# Patient Record
Sex: Female | Born: 1965 | Race: Black or African American | Hispanic: No | Marital: Married | Smoking: Never smoker
Health system: Southern US, Community
[De-identification: ages and names within clinical notes are randomized; demographics above are authoritative.]

## PROBLEM LIST (undated history)

## (undated) DIAGNOSIS — F32A Depression, unspecified: Secondary | ICD-10-CM

## (undated) DIAGNOSIS — F419 Anxiety disorder, unspecified: Secondary | ICD-10-CM

## (undated) DIAGNOSIS — E119 Type 2 diabetes mellitus without complications: Secondary | ICD-10-CM

## (undated) DIAGNOSIS — E7800 Pure hypercholesterolemia, unspecified: Secondary | ICD-10-CM

## (undated) HISTORY — PX: ROTATOR CUFF REPAIR: SHX139

---

## 1998-10-08 ENCOUNTER — Other Ambulatory Visit: Admission: RE | Admit: 1998-10-08 | Discharge: 1998-10-08 | Payer: Self-pay | Admitting: Obstetrics and Gynecology

## 2001-09-01 ENCOUNTER — Other Ambulatory Visit: Admission: RE | Admit: 2001-09-01 | Discharge: 2001-09-01 | Payer: Self-pay | Admitting: Obstetrics and Gynecology

## 2001-11-09 HISTORY — PX: REDUCTION MAMMAPLASTY: SUR839

## 2002-07-13 ENCOUNTER — Encounter: Admission: RE | Admit: 2002-07-13 | Discharge: 2002-07-13 | Payer: Self-pay | Admitting: Obstetrics and Gynecology

## 2002-07-13 ENCOUNTER — Encounter: Payer: Self-pay | Admitting: Obstetrics and Gynecology

## 2002-07-13 ENCOUNTER — Other Ambulatory Visit: Admission: RE | Admit: 2002-07-13 | Discharge: 2002-07-13 | Payer: Self-pay | Admitting: Obstetrics and Gynecology

## 2002-08-07 ENCOUNTER — Ambulatory Visit (HOSPITAL_BASED_OUTPATIENT_CLINIC_OR_DEPARTMENT_OTHER): Admission: RE | Admit: 2002-08-07 | Discharge: 2002-08-08 | Payer: Self-pay | Admitting: Plastic Surgery

## 2002-08-08 ENCOUNTER — Encounter (INDEPENDENT_AMBULATORY_CARE_PROVIDER_SITE_OTHER): Payer: Self-pay | Admitting: Specialist

## 2002-10-12 ENCOUNTER — Other Ambulatory Visit: Admission: RE | Admit: 2002-10-12 | Discharge: 2002-10-12 | Payer: Self-pay | Admitting: Obstetrics and Gynecology

## 2003-06-06 ENCOUNTER — Other Ambulatory Visit: Admission: RE | Admit: 2003-06-06 | Discharge: 2003-06-06 | Payer: Self-pay | Admitting: Obstetrics & Gynecology

## 2003-12-12 ENCOUNTER — Other Ambulatory Visit: Admission: RE | Admit: 2003-12-12 | Discharge: 2003-12-12 | Payer: Self-pay | Admitting: Obstetrics & Gynecology

## 2004-10-31 ENCOUNTER — Encounter: Admission: RE | Admit: 2004-10-31 | Discharge: 2004-10-31 | Payer: Self-pay | Admitting: Obstetrics and Gynecology

## 2006-06-23 ENCOUNTER — Encounter: Admission: RE | Admit: 2006-06-23 | Discharge: 2006-06-23 | Payer: Self-pay | Admitting: Obstetrics and Gynecology

## 2007-07-14 ENCOUNTER — Encounter: Admission: RE | Admit: 2007-07-14 | Discharge: 2007-07-14 | Payer: Self-pay | Admitting: Obstetrics and Gynecology

## 2008-07-19 ENCOUNTER — Encounter: Admission: RE | Admit: 2008-07-19 | Discharge: 2008-07-19 | Payer: Self-pay | Admitting: Obstetrics and Gynecology

## 2009-07-22 ENCOUNTER — Encounter: Admission: RE | Admit: 2009-07-22 | Discharge: 2009-07-22 | Payer: Self-pay | Admitting: Obstetrics and Gynecology

## 2010-07-23 ENCOUNTER — Encounter: Admission: RE | Admit: 2010-07-23 | Discharge: 2010-07-23 | Payer: Self-pay | Admitting: Obstetrics and Gynecology

## 2011-03-27 NOTE — Op Note (Signed)
NAME:  Lisa Finley, Lisa Finley                         ACCOUNT NO.:  [id number]   MEDICAL RECORD NO.:  [id number]                   PATIENT TYPE:  AMB   LOCATION:  DSC                                  FACILITY:  [facility]   PHYSICIAN:  Mary A. Contogiannis, M.D.          DATE OF BIRTH:  1966/07/01   DATE OF PROCEDURE:  08/07/2002  DATE OF DISCHARGE:                                 OPERATIVE REPORT   PREOPERATIVE DIAGNOSES:  Bilateral macromastia.   POSTOPERATIVE DIAGNOSES:  Bilateral macromastia.   OPERATION PERFORMED:  Bilateral reduction mammoplasties.   SURGEON:  Mary A. Contogiannis, M.D.   ASSISTANT:  Charles A. Clearance Coots, M.D.   ANESTHESIA:  General endotracheal.   ANESTHESIOLOGIST:  Kaylyn Layer. Michelle Piper, M.D.   ESTIMATED BLOOD LOSS:  250 cc.   FLUIDS REPLACED:  2800 cc crystalloid   URINE OUTPUT:  600 cc.   COMPLICATIONS:  None.   INDICATIONS FOR PROCEDURE:  The patient is a 45 year old African-American  female who  presents with bilateral macromastia that is clinically  symptomatic.  She complains of backaches, neckaches, shoulder strap groove  marks and pain with intertriginous skin rashes.  She requests that we  proceed with bilateral reduction mammoplasties.   JUSTIFICATION FOR OVERNIGHT STAY:  Progressive pain control along with  ambulation and monitoring of the nipples and breasts.   DESCRIPTION OF PROCEDURE:  The patient was marked in the preop holding area  in the pattern of Wise for the featured bilateral reduction mammoplasties.  She was then taken back to the operating room and placed on the table in the  supine position.  After adequate general endotracheal anesthesia was  obtained, the patient's chest was prepped with Betadine and alcohol and  draped in sterile fashion.   First the right breast reduction was performed.  The nipple was marked with  a 45 mm nipple marker.  This was then incised with the skin de-  epithelialized around the depth of the inframammary  crease in the inferior  pedicle pattern. Next the medial, superior and lateral skin flaps were  elevated down to the chest wall.  The excess fat and glandular tissue were  removed from the inferior pedicle.  The nipple was then examined and found  to be pink and viable.  The wound was then irrigated with saline irrigation.  Meticulous hemostasis was obtained with the Bovie electrocautery.  The  inferior pedicle was then centralized using 3-0 Prolene sutures.  A #10 flat  fully fluted Jackson-Pratt drain was  then placed into the wound.  The skin  edges were brought together inverted T junction with a 2-0 Prolene suture.  The incision was stapled for temporary closure.   Attention was turned to the left breast.  The nipple was marked with a 45 mm  nipple marker.  This was then incised and the skin de-epithelialized down to  the inframammary crease in inferior pedicle pattern.  Next, the medial,  superior and lateral skin flaps were elevated down to the chest wall.  The  excess fat and glandular tissue removed from the inferior pedicle.  The  nipple was then examined and found to be pink and viable.  The wound was  irrigated with saline irrigation.  Meticulous hemostasis was obtained with  the Bovie electrocautery.  The inferior pedicle was centralized using 3-0  Prolene suture.  A #10 flat fully fluted Jackson-Pratt drain was then placed  into the wound.  The skin flaps were brought together at the inverted T  junction with a 2-0 Prolene suture.  The incisions were then stapled for  temporary closure.   The breasts were then compared and found to have good shape and symmetry.  The staples were then removed from all the incisions and they were closed  using 3-0 Monocryl interrupted dermal sutures followed by a 4-0 Monocryl  running intracuticular stitch on the skin.  At this point the patient was  then placed in the upright position.  The future location of the nipple  areolar complexes  were then marked using the 42 mm nipple marker.  She was  then placed back down into the recumbent position.  First the future nipple  areolar complex on the right breast mound was incised.  This tissue was  excised full thickness.  The nipple was then examined and found to be pink  an viable and brought out into this aperture and sewn into place using 4-0  Monocryl interrupted dermal sutures followed by  5-0 Monocryl intracuticular stitch on the skin.  In a likewise fashion, the  future nipple areolar complex was incised on the left breast mound.  This  tissue was excised full thickness.  The nipple was then examined and found  to be pink and viable and brought out into this aperture and sewn in place  using 4-0 Monocryl interrupted dermal sutures followed by 5-0 Monocryl  running intracuticular stitch on the skin.  The Jackson-Pratt drains were  then sewn in place using 3-0 nylon suture.  The incisions were dressed with  Benzoin and Steri-Strips and the nipples additionally with Bacitracin  ointment and Adaptic.  4 x 4 gauzes were then placed over the incisions  around the drains and the patient was placed in light postoperative support  bra.  There were no complications.  The patient tolerated the procedure  well.  The final needle and sponge counts were reported to be correct at the  end of the case.  The patient was then awakened from general anesthesia and  taken to the recovery room in stable condition.  She will remain overnight  in the recovery care center for progressive pain control along with  ambulation and monitoring of the nipples and breast flaps.  Total mass of  breast tissue removed was 1190 grams right breast, 1660 grams left breast.                                               Lamar Blinks. Contogiannis, M.D.    MAC/MEDQ  D:  08/07/2002  T:  08/07/2002  Job:  [id number]   cc:   Leonette Most A. Clearance Coots, M.D.

## 2011-05-27 ENCOUNTER — Other Ambulatory Visit: Payer: Self-pay | Admitting: Obstetrics and Gynecology

## 2011-05-27 DIAGNOSIS — Z1231 Encounter for screening mammogram for malignant neoplasm of breast: Secondary | ICD-10-CM

## 2011-07-27 ENCOUNTER — Ambulatory Visit
Admission: RE | Admit: 2011-07-27 | Discharge: 2011-07-27 | Disposition: A | Discharge status: Home / Self Care | Source: Ambulatory Visit | Attending: Obstetrics and Gynecology | Admitting: Obstetrics and Gynecology

## 2011-07-27 DIAGNOSIS — Z1231 Encounter for screening mammogram for malignant neoplasm of breast: Secondary | ICD-10-CM

## 2012-06-16 ENCOUNTER — Other Ambulatory Visit: Payer: Self-pay | Admitting: Obstetrics and Gynecology

## 2012-06-16 DIAGNOSIS — Z1231 Encounter for screening mammogram for malignant neoplasm of breast: Secondary | ICD-10-CM

## 2012-07-27 ENCOUNTER — Ambulatory Visit
Admission: RE | Admit: 2012-07-27 | Discharge: 2012-07-27 | Disposition: A | Discharge status: Home / Self Care | Source: Ambulatory Visit | Attending: Obstetrics and Gynecology | Admitting: Obstetrics and Gynecology

## 2012-07-27 DIAGNOSIS — Z1231 Encounter for screening mammogram for malignant neoplasm of breast: Secondary | ICD-10-CM

## 2012-08-01 ENCOUNTER — Ambulatory Visit

## 2013-06-22 ENCOUNTER — Other Ambulatory Visit: Payer: Self-pay

## 2013-06-22 DIAGNOSIS — Z1231 Encounter for screening mammogram for malignant neoplasm of breast: Secondary | ICD-10-CM

## 2013-08-01 ENCOUNTER — Ambulatory Visit

## 2013-09-01 ENCOUNTER — Ambulatory Visit
Admission: RE | Admit: 2013-09-01 | Discharge: 2013-09-01 | Disposition: A | Discharge status: Home / Self Care | Payer: BC Managed Care – PPO | Source: Ambulatory Visit

## 2013-09-01 DIAGNOSIS — Z1231 Encounter for screening mammogram for malignant neoplasm of breast: Secondary | ICD-10-CM

## 2014-08-02 ENCOUNTER — Other Ambulatory Visit: Payer: Self-pay

## 2014-08-02 DIAGNOSIS — Z1231 Encounter for screening mammogram for malignant neoplasm of breast: Secondary | ICD-10-CM

## 2014-09-04 ENCOUNTER — Ambulatory Visit
Admission: RE | Admit: 2014-09-04 | Discharge: 2014-09-04 | Disposition: A | Discharge status: Home / Self Care | Payer: BC Managed Care – PPO | Source: Ambulatory Visit

## 2014-09-04 DIAGNOSIS — Z1231 Encounter for screening mammogram for malignant neoplasm of breast: Secondary | ICD-10-CM

## 2016-01-21 ENCOUNTER — Other Ambulatory Visit: Payer: Self-pay

## 2016-01-21 DIAGNOSIS — Z9889 Other specified postprocedural states: Secondary | ICD-10-CM

## 2016-01-21 DIAGNOSIS — Z1231 Encounter for screening mammogram for malignant neoplasm of breast: Secondary | ICD-10-CM

## 2016-02-17 ENCOUNTER — Ambulatory Visit
Admission: RE | Admit: 2016-02-17 | Discharge: 2016-02-17 | Disposition: A | Discharge status: Home / Self Care | Payer: BC Managed Care – PPO | Source: Ambulatory Visit

## 2016-02-17 DIAGNOSIS — Z1231 Encounter for screening mammogram for malignant neoplasm of breast: Secondary | ICD-10-CM

## 2016-02-17 DIAGNOSIS — Z9889 Other specified postprocedural states: Secondary | ICD-10-CM

## 2017-02-23 ENCOUNTER — Other Ambulatory Visit: Payer: Self-pay | Admitting: Family Medicine

## 2017-02-23 DIAGNOSIS — Z1231 Encounter for screening mammogram for malignant neoplasm of breast: Secondary | ICD-10-CM

## 2017-03-15 ENCOUNTER — Ambulatory Visit
Admission: RE | Admit: 2017-03-15 | Discharge: 2017-03-15 | Disposition: A | Discharge status: Home / Self Care | Payer: BC Managed Care – PPO | Source: Ambulatory Visit | Attending: Family Medicine | Admitting: Family Medicine

## 2017-03-15 DIAGNOSIS — Z1231 Encounter for screening mammogram for malignant neoplasm of breast: Secondary | ICD-10-CM

## 2017-07-02 ENCOUNTER — Encounter: Payer: Self-pay | Admitting: Emergency Medicine

## 2017-07-02 ENCOUNTER — Emergency Department (INDEPENDENT_AMBULATORY_CARE_PROVIDER_SITE_OTHER)
Admission: EM | Admit: 2017-07-02 | Discharge: 2017-07-02 | Disposition: A | Discharge status: Home / Self Care | Payer: BC Managed Care – PPO | Source: Home / Self Care | Attending: Family Medicine | Admitting: Family Medicine

## 2017-07-02 DIAGNOSIS — K645 Perianal venous thrombosis: Secondary | ICD-10-CM | POA: Diagnosis not present

## 2017-07-02 MED ORDER — KETOROLAC TROMETHAMINE 60 MG/2ML IM SOLN
60.0000 mg | Freq: Once | INTRAMUSCULAR | Status: AC
  Administered 2017-07-02: 60 mg via INTRAMUSCULAR

## 2017-07-02 MED ORDER — CLINDAMYCIN HCL 300 MG PO CAPS: ORAL_CAPSULE | ORAL | 0 refills | Status: AC

## 2017-07-02 MED ORDER — HYDROCODONE-ACETAMINOPHEN 5-325 MG PO TABS
1.0000 | ORAL_TABLET | Freq: Four times a day (QID) | ORAL | 0 refills | Status: AC | PRN
Start: 2017-07-02 — End: ?

## 2017-07-02 NOTE — Discharge Instructions (Signed)
Begin clear liquids for about 24 hours, then may begin a [employer] (Bananas, Rice, Applesauce, Toast) for a day.  Then gradually advance to a regular diet as tolerated.     May begin warm sitz baths once or twice daily in 24 hours. If symptoms become significantly worse during the night or over the weekend, proceed to the local emergency room.

## 2017-07-02 NOTE — ED Triage Notes (Signed)
Pt c/o hemorrhoids after straining to have a BM today. Reports hx of hemorrhoids and last BM x2 days ago.

## 2017-07-02 NOTE — ED Provider Notes (Signed)
[facility]    CSN: [id number] Arrival date & time: 07/02/17  1809     History   Chief Complaint Chief Complaint  Patient presents with  . Hemorrhoids    HPI Lisa Finley is a 51 y.o. female.   Patient reports presence of several painful enlarged hemorrhoids after straining during a bowel movement today.  Her last normal bowel movement was two days ago.  She took a laxative today and had colicky abdominal cramping.  She has had thrombosed hemorrhoids in the past.   The history is provided by the patient and the spouse.    History reviewed. No pertinent past medical history.  There are no active problems to display for this patient.   Past Surgical History:  Procedure Laterality Date  . REDUCTION MAMMAPLASTY Bilateral 2003    OB History    No data available       Home Medications    Prior to Admission medications   Medication Sig Start Date End Date Taking? Authorizing Provider  ALPRAZolam Prudy Feeler) 0.25 MG tablet Take 0.25 mg by mouth at bedtime as needed for anxiety.   Yes [provider]  metFORMIN (GLUCOPHAGE) 500 MG tablet Take by mouth 2 (two) times daily with a meal.   Yes [provider]  clindamycin (CLEOCIN) 300 MG capsule Take one cap by mouth every 8 hours 07/02/17   Lattie Haw, MD  HYDROcodone-acetaminophen (NORCO/VICODIN) 5-325 MG tablet Take 1 tablet by mouth every 6 (six) hours as needed for moderate pain. 07/02/17   Lattie Haw, MD    Family History Family History  Problem Relation Age of Onset  . Breast cancer Mother     Social History Social History  Substance Use Topics  . Smoking status: Never Smoker  . Smokeless tobacco: Never Used  . Alcohol use Yes     Allergies   Patient has no allergy information on record.   Review of Systems Review of Systems  Constitutional: Negative for activity change, appetite change, chills, diaphoresis, fatigue and fever.  HENT: Negative.   Eyes:  Negative.   Respiratory: Negative.   Cardiovascular: Negative.   Gastrointestinal: Positive for abdominal pain, constipation and rectal pain. Negative for abdominal distention, anal bleeding, blood in stool, diarrhea, nausea and vomiting.  Genitourinary: Negative.   Musculoskeletal: Negative.   Skin: Negative.   Neurological: Negative for headaches.     Physical Exam Triage Vital Signs ED Triage Vitals  Enc Vitals Group     BP 07/02/17 1817 139/86     Pulse Rate 07/02/17 1817 98     Resp --      Temp 07/02/17 1817 98 F (36.7 C)     Temp Source 07/02/17 1817 Oral     SpO2 07/02/17 1817 98 %     Weight --      Height --      Head Circumference --      Peak Flow --      Pain Score 07/02/17 1819 0     Pain Loc --      Pain Edu? --      Excl. in GC? --    No data found.   Updated Vital Signs BP 139/86 (BP Location: Right Arm)   Pulse 98   Temp 98 F (36.7 C) (Oral)   SpO2 98%   Visual Acuity Right Eye Distance:   Left Eye Distance:   Bilateral Distance:    Right Eye Near:   Left Eye  Near:    Bilateral Near:     Physical Exam  Constitutional: She appears well-developed and well-nourished. No distress.  HENT:  Head: Normocephalic.  Eyes: Pupils are equal, round, and reactive to light.  Cardiovascular: Normal heart sounds.   Pulmonary/Chest: Breath sounds normal.  Abdominal: She exhibits no distension. There is no tenderness.  Genitourinary:     Genitourinary Comments: Anus reveals slightly prolapsed rectum and thrombosed hemorrhoids, tender to palpation.  Musculoskeletal: She exhibits no edema.  Neurological: She is alert.  Skin: Skin is warm and dry.  Nursing note and vitals reviewed.    UC Treatments / Results  Labs (all labs ordered are listed, but only abnormal results are displayed) Labs Reviewed - No data to display  EKG  EKG Interpretation None       Radiology No results found.  Procedures Procedures  I and D thrombosed  hemorrhoid After explaining risks/benefits of procedure and obtaining consent from patient, cleaned anus and surrounding area with Betadine.  Using sterile technique, obtained local anesthesia with topical refrigerant spray and local 1% lidocaine with epinephrine.  Then made three 25mm radial incisions in the thrombosed hemorrhoids with #11 blade, releasing small amount of clotted blood.  Applied sterile absorbent dressing.  Wound precautions given.  Re-check in 24 hours.   Medications Ordered in UC Medications  ketorolac (TORADOL) injection 60 mg (60 mg Intramuscular Given 07/02/17 1831)     Initial Impression / Assessment and Plan / UC Course  I have reviewed the triage vital signs and the nursing notes.  Pertinent labs & imaging results that were available during my care of the patient were reviewed by me and considered in my medical decision making (see chart for details).    Begin empiric clindamycin 300mg  TID.  Rx Lortab #12, no refill. Controlled Substance Prescriptions I have consulted the [place] Controlled Substances Registry for this patient, and feel the risk/benefit ratio today is favorable for proceeding with this prescription for a controlled substance.  Begin clear liquids for about 24 hours, then may begin a [employer] (Bananas, Rice, Applesauce, Toast) for a day.  Then gradually advance to a regular diet as tolerated.     May begin warm sitz baths once or twice daily in 24 hours. If symptoms become significantly worse during the night or over the weekend, proceed to the local emergency room. Followup with Family Doctor in about 3 days.    Final Clinical Impressions(s) / UC Diagnoses   Final diagnoses:  Internal and external thrombosed hemorrhoids    New Prescriptions New Prescriptions   CLINDAMYCIN (CLEOCIN) 300 MG CAPSULE    Take one cap by mouth every 8 hours   HYDROCODONE-ACETAMINOPHEN (NORCO/VICODIN) 5-325 MG TABLET    Take 1 tablet by mouth every 6 (six) hours as  needed for moderate pain.         Lattie Haw, MD 07/05/17 1435

## 2018-03-08 ENCOUNTER — Other Ambulatory Visit: Payer: Self-pay | Admitting: Family Medicine

## 2018-03-08 DIAGNOSIS — Z1231 Encounter for screening mammogram for malignant neoplasm of breast: Secondary | ICD-10-CM

## 2018-03-30 ENCOUNTER — Ambulatory Visit
Admission: RE | Admit: 2018-03-30 | Discharge: 2018-03-30 | Disposition: A | Discharge status: Home / Self Care | Payer: BC Managed Care – PPO | Source: Ambulatory Visit | Attending: Family Medicine | Admitting: Family Medicine

## 2018-03-30 DIAGNOSIS — Z1231 Encounter for screening mammogram for malignant neoplasm of breast: Secondary | ICD-10-CM

## 2019-03-08 ENCOUNTER — Other Ambulatory Visit: Payer: Self-pay | Admitting: Family Medicine

## 2019-03-08 DIAGNOSIS — Z1231 Encounter for screening mammogram for malignant neoplasm of breast: Secondary | ICD-10-CM

## 2019-05-03 ENCOUNTER — Ambulatory Visit
Admission: RE | Admit: 2019-05-03 | Discharge: 2019-05-03 | Disposition: A | Discharge status: Home / Self Care | Payer: BC Managed Care – PPO | Source: Ambulatory Visit | Attending: Family Medicine | Admitting: Family Medicine

## 2019-05-03 ENCOUNTER — Other Ambulatory Visit: Payer: Self-pay

## 2019-05-03 DIAGNOSIS — Z1231 Encounter for screening mammogram for malignant neoplasm of breast: Secondary | ICD-10-CM

## 2020-06-05 ENCOUNTER — Other Ambulatory Visit: Payer: Self-pay | Admitting: Family Medicine

## 2020-06-05 DIAGNOSIS — Z1231 Encounter for screening mammogram for malignant neoplasm of breast: Secondary | ICD-10-CM

## 2020-10-28 IMAGING — MG DIGITAL SCREENING BILATERAL MAMMOGRAM WITH TOMO AND CAD
6 of 10 series · 6 of 30 positions shown · non-contrast
Comparison: Previous exam(s).

CLINICAL DATA: Screening.

EXAM:
DIGITAL SCREENING BILATERAL MAMMOGRAM WITH TOMO AND CAD

[L CC synth-2D]
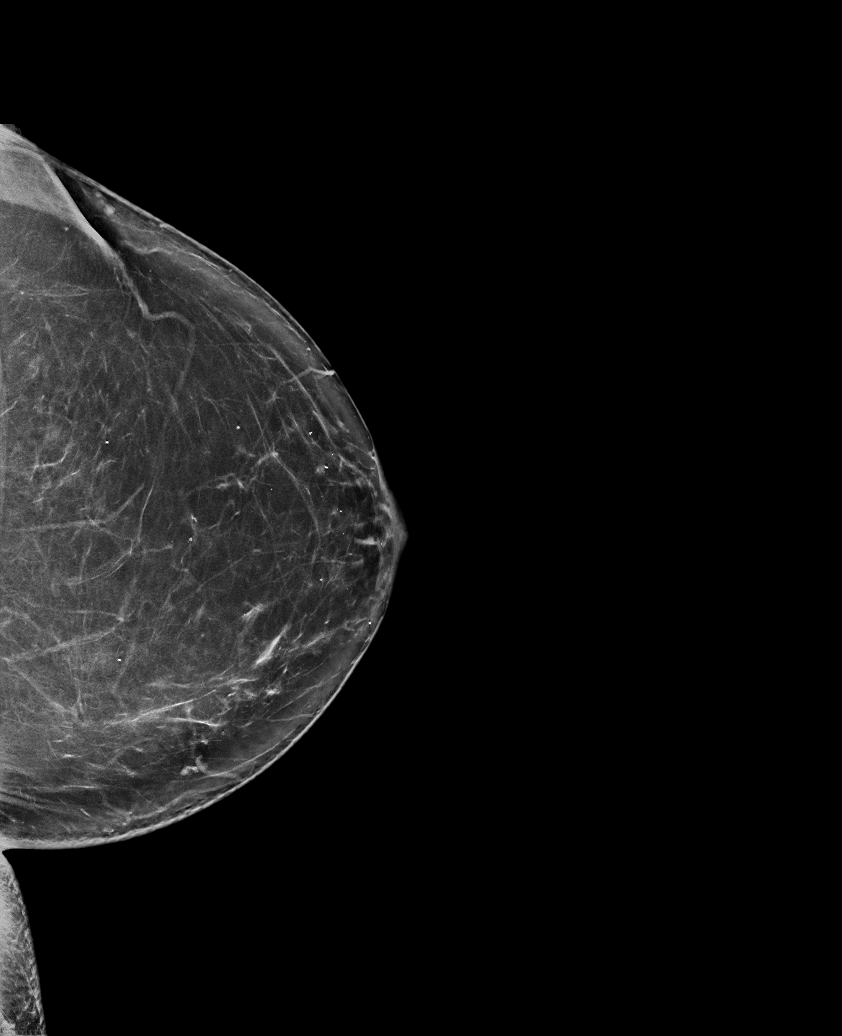

[R CC synth-2D]
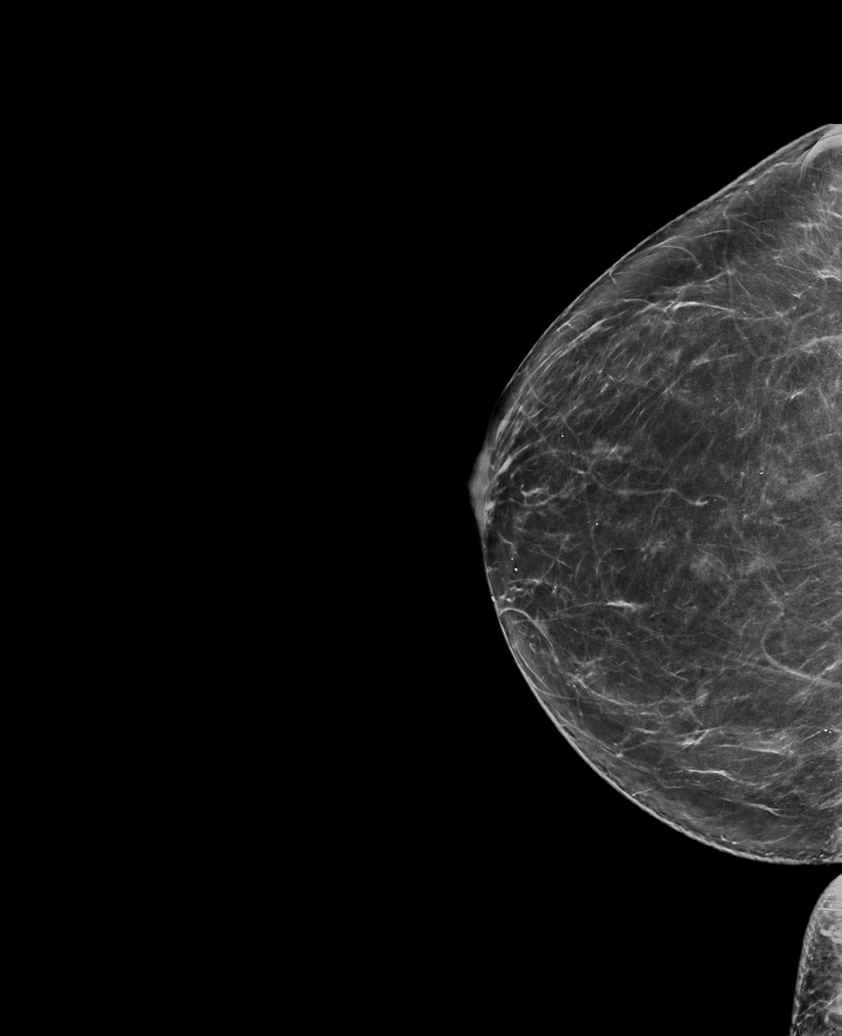

[R MLO synth-2D]
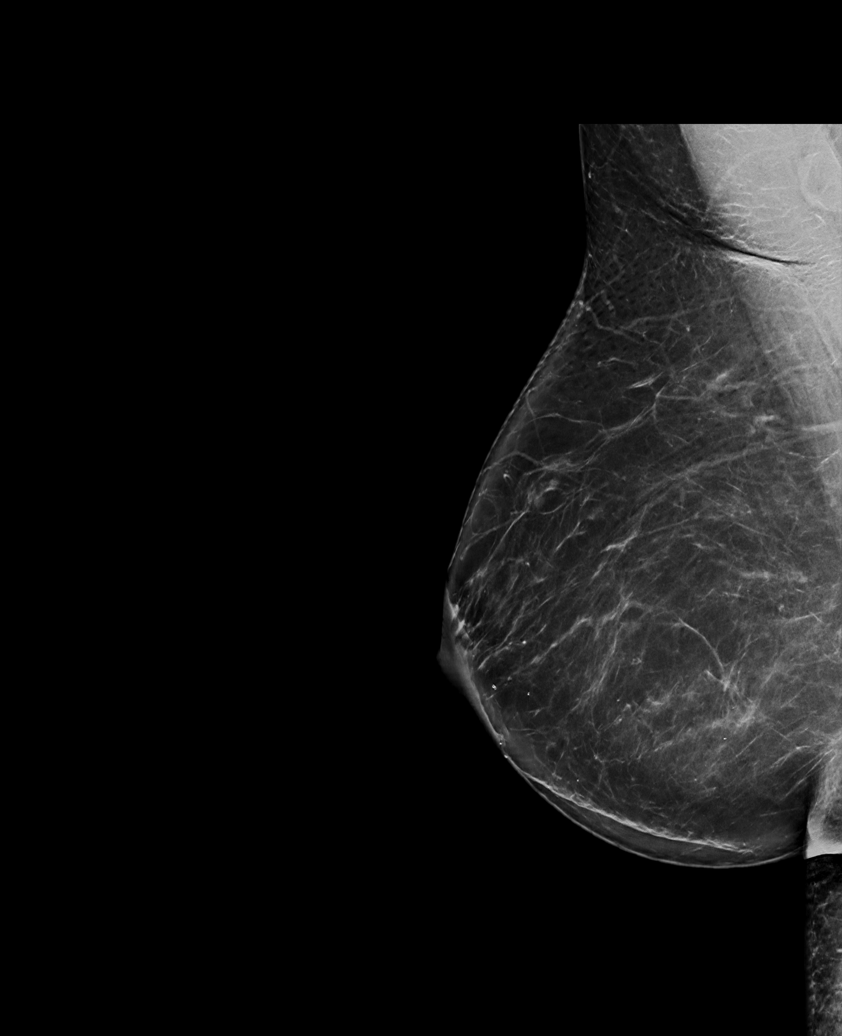

[L MLO synth-2D (1 of 2)]
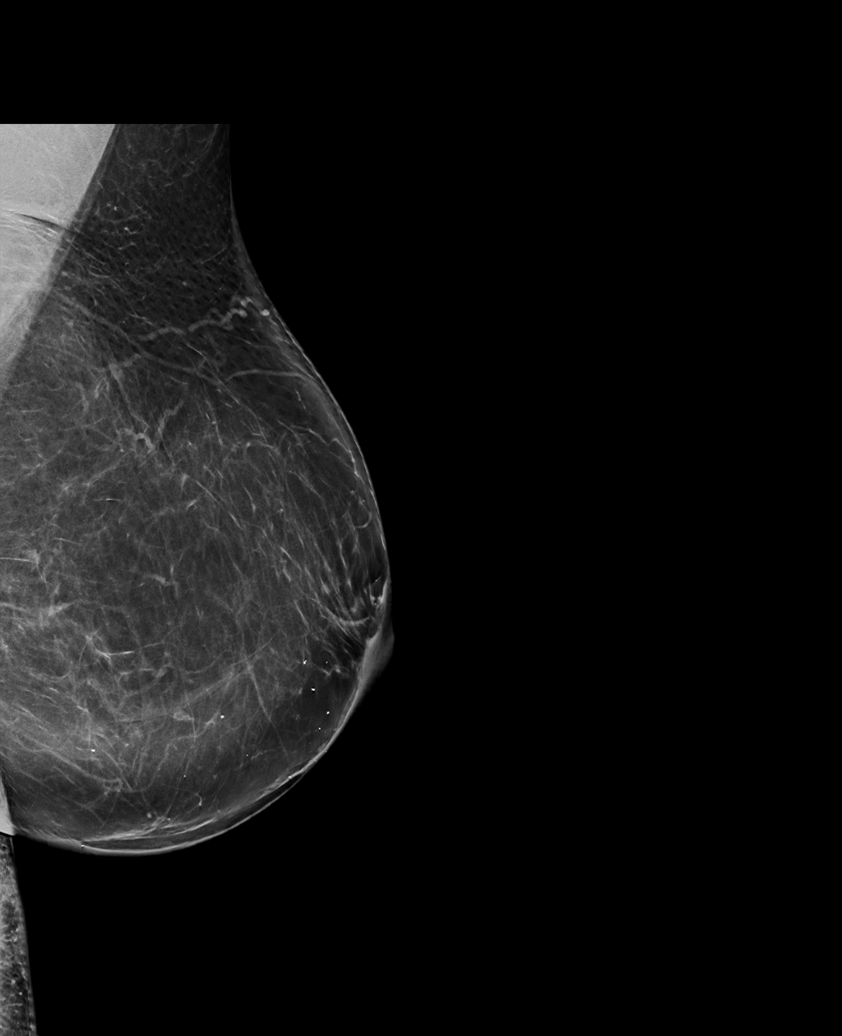

[L MLO synth-2D (2 of 2)]
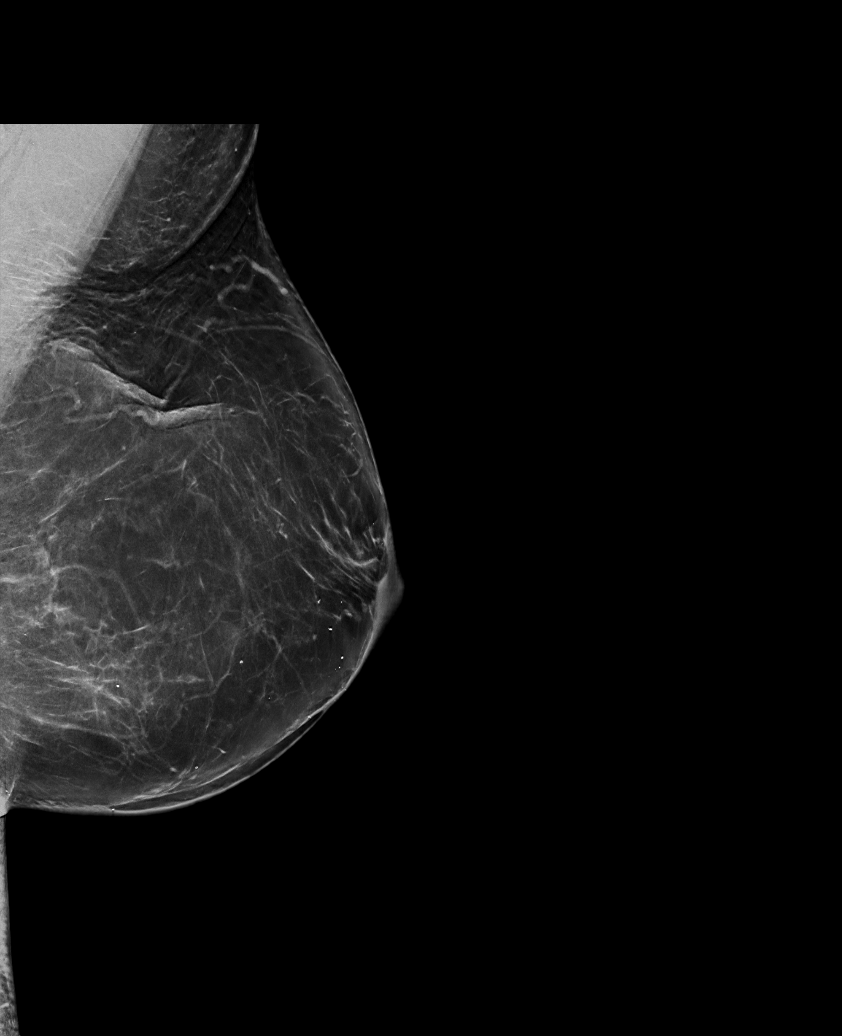

[R CC tomo · tomo slice 37/73.0]
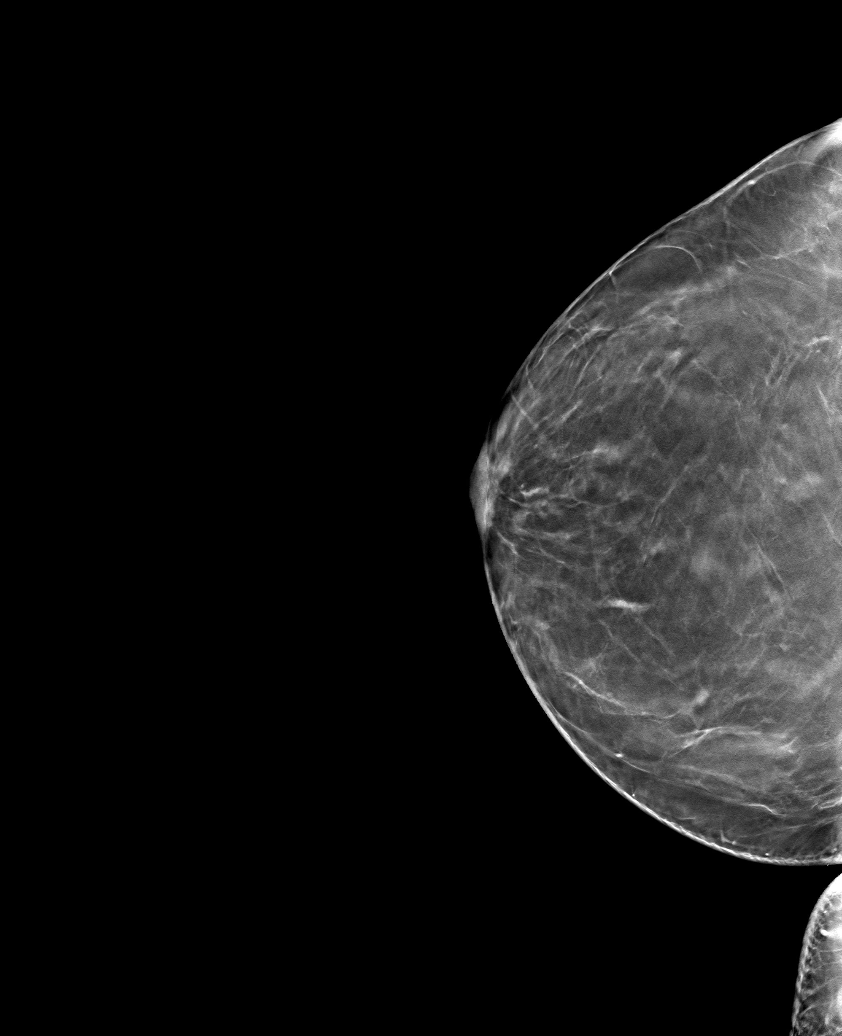

[6 of 30 positions shown; findings below may reference images not displayed]

ACR Breast Density Category b: There are scattered areas of
fibroglandular density.
FINDINGS: There are no findings suspicious for malignancy. Images were
processed with CAD.
IMPRESSION: No mammographic evidence of malignancy. A result letter of this
screening mammogram will be mailed directly to the patient.

RECOMMENDATION:
Screening mammogram in one year. (Code:CN-U-775)

BI-RADS CATEGORY  1: Negative.

## 2021-03-28 ENCOUNTER — Other Ambulatory Visit: Payer: Self-pay | Admitting: Family Medicine

## 2021-03-28 DIAGNOSIS — Z1231 Encounter for screening mammogram for malignant neoplasm of breast: Secondary | ICD-10-CM

## 2021-05-28 ENCOUNTER — Ambulatory Visit
Admission: RE | Admit: 2021-05-28 | Discharge: 2021-05-28 | Disposition: A | Discharge status: Home / Self Care | Payer: BC Managed Care – PPO | Source: Ambulatory Visit | Attending: Family Medicine | Admitting: Family Medicine

## 2021-05-28 ENCOUNTER — Other Ambulatory Visit: Payer: Self-pay

## 2021-05-28 DIAGNOSIS — Z1231 Encounter for screening mammogram for malignant neoplasm of breast: Secondary | ICD-10-CM

## 2022-07-08 ENCOUNTER — Other Ambulatory Visit: Payer: Self-pay | Admitting: Family Medicine

## 2022-07-08 DIAGNOSIS — Z1231 Encounter for screening mammogram for malignant neoplasm of breast: Secondary | ICD-10-CM

## 2022-07-24 ENCOUNTER — Ambulatory Visit
Admission: RE | Admit: 2022-07-24 | Discharge: 2022-07-24 | Disposition: A | Discharge status: Home / Self Care | Payer: BC Managed Care – PPO | Source: Ambulatory Visit | Attending: Family Medicine | Admitting: Family Medicine

## 2022-07-24 DIAGNOSIS — Z1231 Encounter for screening mammogram for malignant neoplasm of breast: Secondary | ICD-10-CM

## 2022-11-23 IMAGING — MG MM DIGITAL SCREENING BILAT W/ TOMO AND CAD
8 series · 8 of 24 positions shown · non-contrast
Comparison: Previous exam(s).

CLINICAL DATA: Screening.

EXAM:
DIGITAL SCREENING BILATERAL MAMMOGRAM WITH TOMOSYNTHESIS AND CAD
TECHNIQUE: Bilateral screening digital craniocaudal and mediolateral oblique
mammograms were obtained. Bilateral screening digital breast
tomosynthesis was performed. The images were evaluated with
computer-aided detection.

[R MLO synth-2D]
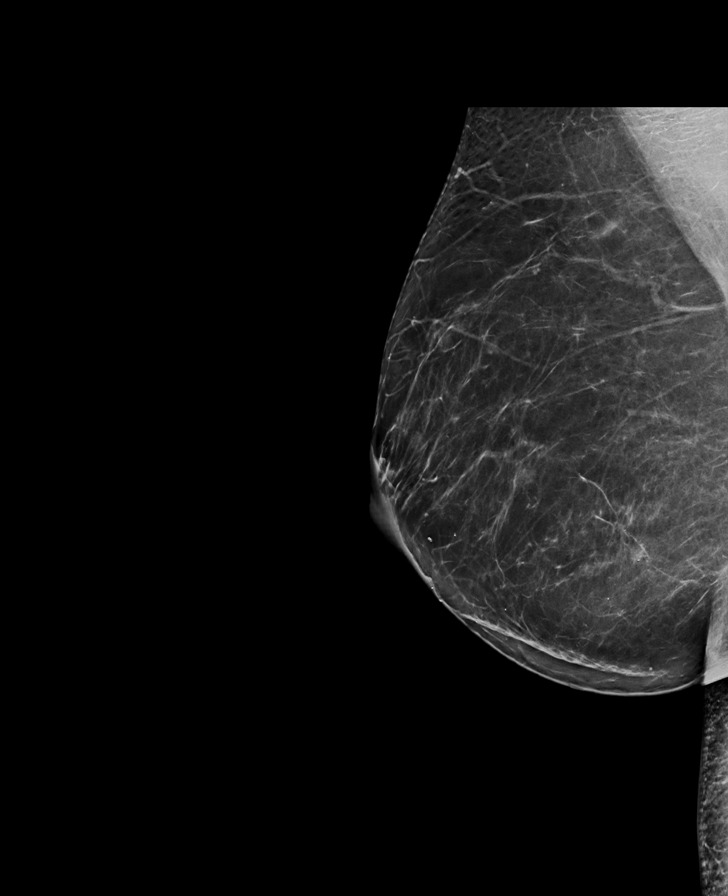

[L CC synth-2D]
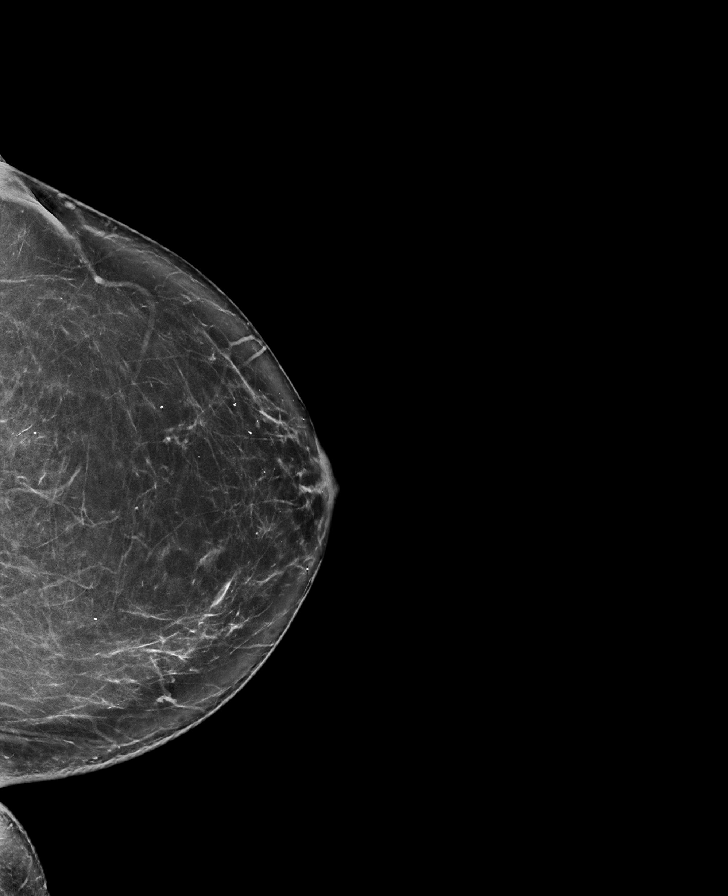

[L MLO synth-2D]
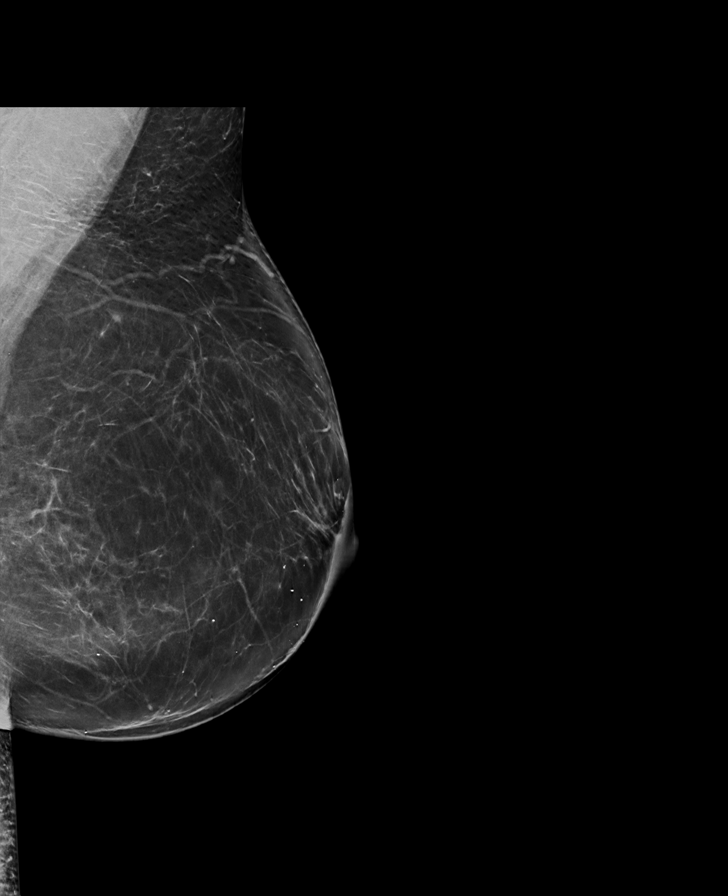

[R CC synth-2D]
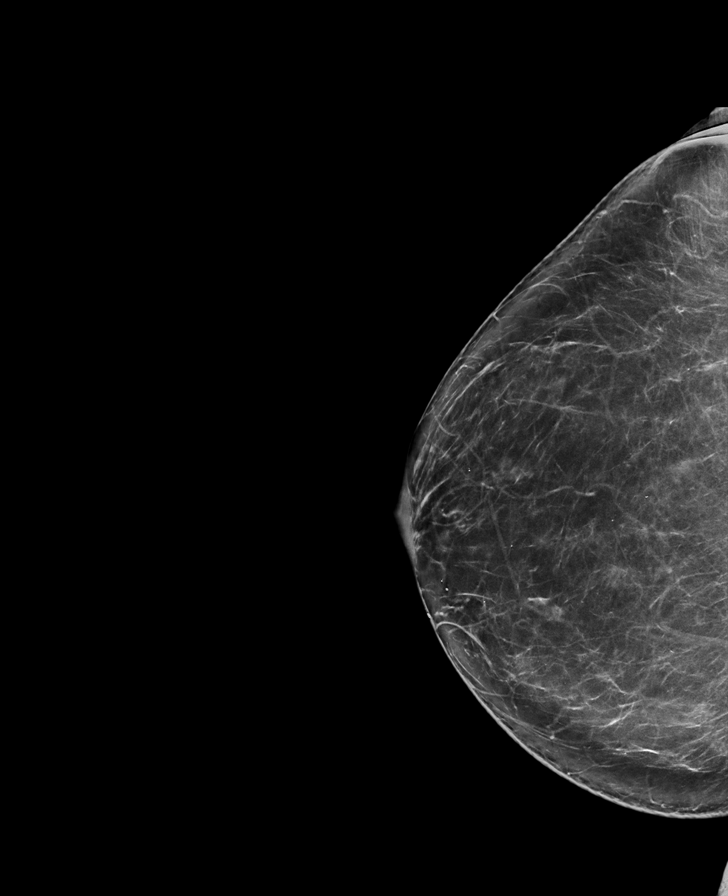

[L CC tomo · tomo slice 41/80.0]
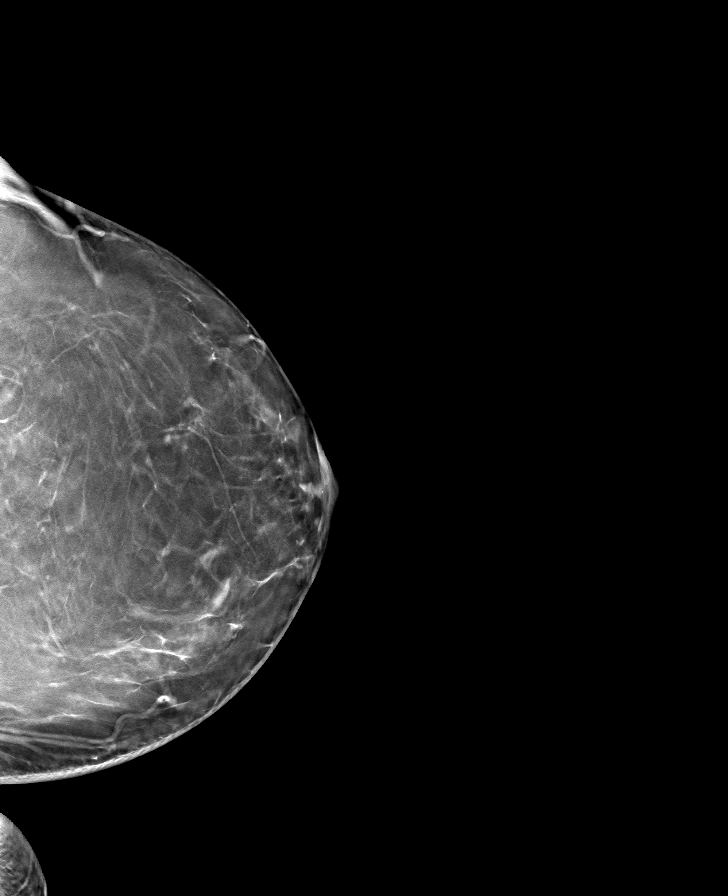

[R CC tomo · tomo slice 39/78.0]
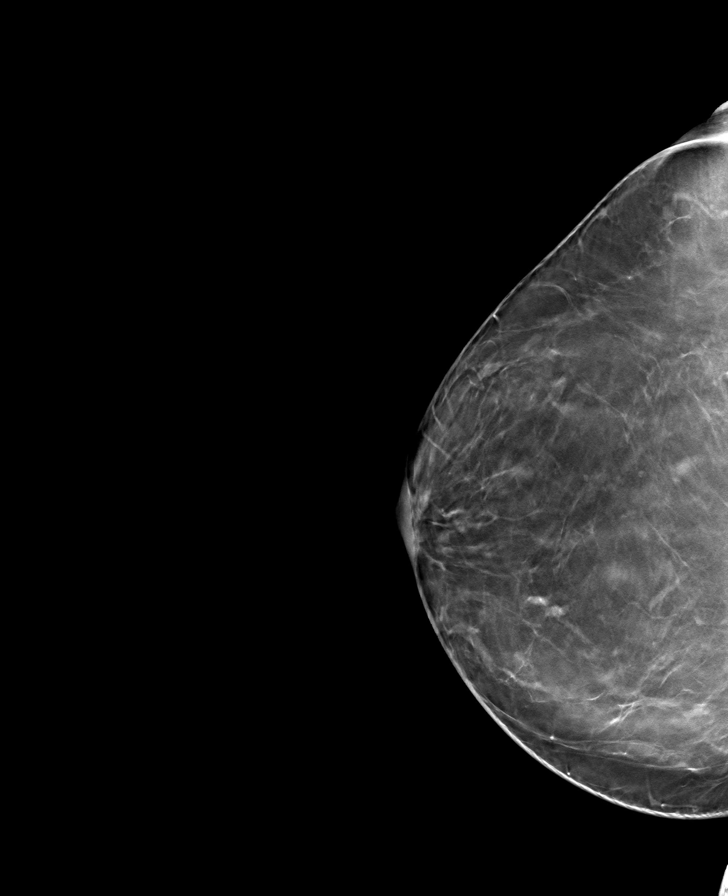

[R MLO tomo · tomo slice 41/80.0]
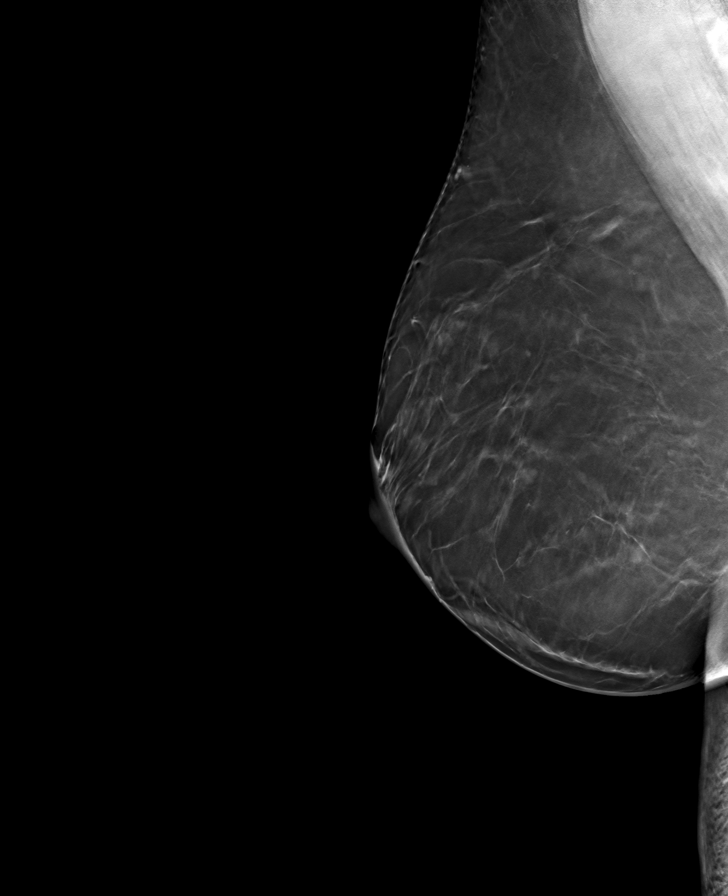

[L MLO tomo · tomo slice 43/84.0]
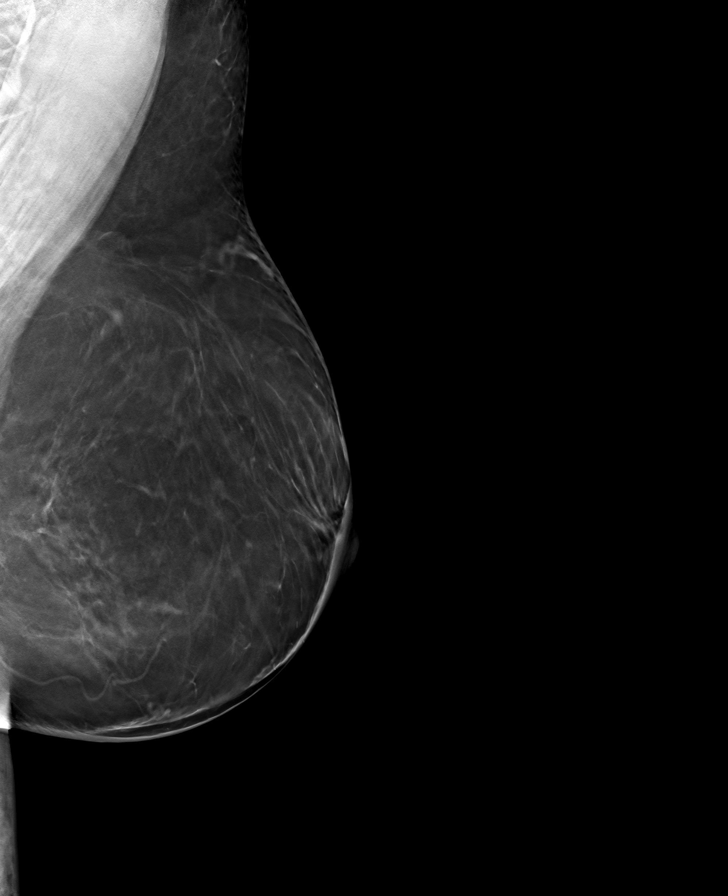

[8 of 24 positions shown; findings below may reference images not displayed]

ACR Breast Density Category b: There are scattered areas of
fibroglandular density.
FINDINGS: There are no findings suspicious for malignancy.
IMPRESSION: No mammographic evidence of malignancy. A result letter of this
screening mammogram will be mailed directly to the patient.

RECOMMENDATION:
Screening mammogram in one year. (Code:51-O-LD2)

BI-RADS CATEGORY  1: Negative.

## 2023-06-21 ENCOUNTER — Other Ambulatory Visit: Payer: Self-pay | Admitting: Family Medicine

## 2023-06-21 DIAGNOSIS — Z0000 Encounter for general adult medical examination without abnormal findings: Secondary | ICD-10-CM

## 2023-07-30 ENCOUNTER — Ambulatory Visit
Admission: RE | Admit: 2023-07-30 | Discharge: 2023-07-30 | Disposition: A | Discharge status: Home / Self Care | Payer: BC Managed Care – PPO | Source: Ambulatory Visit | Attending: Family Medicine | Admitting: Family Medicine

## 2023-07-30 DIAGNOSIS — Z0000 Encounter for general adult medical examination without abnormal findings: Secondary | ICD-10-CM

## 2023-12-12 ENCOUNTER — Other Ambulatory Visit: Payer: Self-pay

## 2023-12-12 ENCOUNTER — Ambulatory Visit
Admission: EM | Admit: 2023-12-12 | Discharge: 2023-12-12 | Disposition: A | Discharge status: Home / Self Care | Payer: 59

## 2023-12-12 ENCOUNTER — Telehealth: Payer: Self-pay

## 2023-12-12 DIAGNOSIS — J101 Influenza due to other identified influenza virus with other respiratory manifestations: Secondary | ICD-10-CM | POA: Diagnosis not present

## 2023-12-12 HISTORY — DX: Anxiety disorder, unspecified: F41.9

## 2023-12-12 HISTORY — DX: Depression, unspecified: F32.A

## 2023-12-12 HISTORY — DX: Pure hypercholesterolemia, unspecified: E78.00

## 2023-12-12 HISTORY — DX: Type 2 diabetes mellitus without complications: E11.9

## 2023-12-12 LAB — POCT INFLUENZA A/B
Influenza A, POC: POSITIVE — AB
Influenza B, POC: NEGATIVE

## 2023-12-12 MED ORDER — PROMETHAZINE-DM 6.25-15 MG/5ML PO SYRP
5.0000 mL | ORAL_SOLUTION | Freq: Four times a day (QID) | ORAL | 0 refills | Status: DC | PRN
Start: 2023-12-12 — End: 2023-12-12

## 2023-12-12 MED ORDER — PROMETHAZINE-DM 6.25-15 MG/5ML PO SYRP: 5.0000 mL | ORAL_SOLUTION | Freq: Four times a day (QID) | ORAL | 0 refills | Status: AC | PRN

## 2023-12-12 MED ORDER — OSELTAMIVIR PHOSPHATE 75 MG PO CAPS: 75.0000 mg | ORAL_CAPSULE | Freq: Two times a day (BID) | ORAL | 0 refills | Status: DC

## 2023-12-12 MED ORDER — ACETAMINOPHEN 325 MG PO TABS
975.0000 mg | ORAL_TABLET | Freq: Four times a day (QID) | ORAL | Status: DC | PRN
  Administered 2023-12-12: 975 mg via ORAL

## 2023-12-12 MED ORDER — OSELTAMIVIR PHOSPHATE 75 MG PO CAPS: 75.0000 mg | ORAL_CAPSULE | Freq: Two times a day (BID) | ORAL | 0 refills | Status: AC

## 2023-12-12 NOTE — Discharge Instructions (Signed)
Influenza test is positive.  You are considered contagious to others as long as you have a measurable fever with a temperature 100 F.  You should consider yourself less infectious after being fever  free for 24 hours without fever lowering medications. Continue to alternate Tylenol and ibuprofen for management of fever.   Force fluids to maintain hydration. Tamiflu once daily for the next 5 days to reduce symptoms and course of influenza virus.  Promethazine DM for cough and congestion.  If you develop any shortness of breath, wheezing or difficulty breathing go immediately to the nearest emergency department.

## 2023-12-12 NOTE — ED Provider Notes (Addendum)
[facility]    CSN: [id number] Arrival date & time: 12/12/23  1233      History   Chief Complaint Chief Complaint  Patient presents with   Fever    HPI Lisa Finley is a 58 y.o. female.  Patient here today for evaluation of bodyaches, headache, cough, fatigue, poor appetite.  On arrival patient had a fever with a temperature of 103.1. Patient denies any shortness of breath or wheezing.  She denies any history of asthma but reports years back had flu which progressed to pneumonia.  She has had her influenza vaccine for this season. She has been taking Mucinex for symptoms.   Past Medical History:  Diagnosis Date   Anxiety    Depression    Diabetes (HCC)    Hypercholesteremia     There are no active problems to display for this patient.   Past Surgical History:  Procedure Laterality Date   REDUCTION MAMMAPLASTY Bilateral 2003   ROTATOR CUFF REPAIR      OB History   No obstetric history on file.      Home Medications    Prior to Admission medications   Medication Sig Start Date End Date Taking? Authorizing Provider  citalopram (CELEXA) 10 MG tablet Take 10 mg by mouth daily.   Yes [provider]  ibuprofen (ADVIL) 600 MG tablet Take 600 mg by mouth every 6 (six) hours as needed.   Yes [provider]  linaclotide (LINZESS) 72 MCG capsule Take 72 mcg by mouth daily before breakfast.   Yes [provider]  pravastatin (PRAVACHOL) 10 MG tablet Take 10 mg by mouth daily.   Yes [provider]  Semaglutide,0.25 or 0.5MG /DOS, (OZEMPIC, 0.25 OR 0.5 MG/DOSE,) 2 MG/1.5ML SOPN Inject into the skin.   Yes [provider]  ALPRAZolam (XANAX) 0.25 MG tablet Take 0.25 mg by mouth at bedtime as needed for anxiety.    [provider]  clindamycin (CLEOCIN) 300 MG capsule Take one cap by mouth every 8 hours 07/02/17   Lattie Haw, MD  HYDROcodone-acetaminophen (NORCO/VICODIN) 5-325 MG tablet Take 1 tablet by  mouth every 6 (six) hours as needed for moderate pain. 07/02/17   Lattie Haw, MD  metFORMIN (GLUCOPHAGE) 500 MG tablet Take by mouth 2 (two) times daily with a meal.    [provider]  oseltamivir (TAMIFLU) 75 MG capsule Take 1 capsule (75 mg total) by mouth 2 (two) times daily. 12/12/23   Bing Neighbors, NP  promethazine-dextromethorphan (PROMETHAZINE-DM) 6.25-15 MG/5ML syrup Take 5 mLs by mouth 4 (four) times daily as needed for cough. 12/12/23   Bing Neighbors, NP    Family History Family History  Problem Relation Age of Onset   Breast cancer Mother     Social History Social History   Tobacco Use   Smoking status: Never   Smokeless tobacco: Never  Vaping Use   Vaping status: Never Used  Substance Use Topics   Alcohol use: Yes   Drug use: No     Allergies   Patient has no known allergies.   Review of Systems Review of Systems  Constitutional:  Positive for fever.     Physical Exam Triage Vital Signs ED Triage Vitals  Encounter Vitals Group     BP 12/12/23 1316 133/73     Systolic BP Percentile --      Diastolic BP Percentile --      Pulse Rate 12/12/23 1316 (!) 118  Resp 12/12/23 1316 16     Temp 12/12/23 1316 (!) 103.1 F (39.5 C)     Temp Source 12/12/23 1316 Oral     SpO2 12/12/23 1316 97 %     Weight --      Height --      Head Circumference --      Peak Flow --      Pain Score 12/12/23 1322 8     Pain Loc --      Pain Education --      Exclude from Growth Chart --    No data found.  Updated Vital Signs BP 133/73   Pulse (!) 105   Temp 99.8 F (37.7 C)   Resp 16   SpO2 97%   Visual Acuity Right Eye Distance:   Left Eye Distance:   Bilateral Distance:    Right Eye Near:   Left Eye Near:    Bilateral Near:     Physical Exam Constitutional:      Appearance: She is well-developed. She is ill-appearing.  HENT:     Head: Normocephalic and atraumatic.     Right Ear: External ear normal.     Left Ear: External ear  normal.     Nose: Congestion and rhinorrhea present.     Mouth/Throat:     Mouth: No oral lesions.     Pharynx: Uvula midline. Pharyngeal swelling and uvula swelling present. No oropharyngeal exudate or posterior oropharyngeal erythema.     Tonsils: No tonsillar exudate or tonsillar abscesses. 0 on the right. 0 on the left.  Eyes:     Extraocular Movements: Extraocular movements intact.     Pupils: Pupils are equal, round, and reactive to light.  Cardiovascular:     Rate and Rhythm: Regular rhythm. Tachycardia present.  Pulmonary:     Effort: Pulmonary effort is normal.     Breath sounds: Normal breath sounds. Decreased air movement present.  Musculoskeletal:     Cervical back: Normal range of motion and neck supple.  Skin:    General: Skin is warm and dry.     Capillary Refill: Capillary refill takes less than 2 seconds.  Neurological:     General: No focal deficit present.     Mental Status: She is alert and oriented to person, place, and time.  Psychiatric:        Mood and Affect: Mood normal.        Behavior: Behavior normal.      UC Treatments / Results  Labs (all labs ordered are listed, but only abnormal results are displayed) Labs Reviewed  POCT INFLUENZA A/B - Abnormal; Notable for the following components:      Result Value   Influenza A, POC Positive (*)    All other components within normal limits    EKG   Radiology No results found.  Procedures Procedures (including critical care time)  Medications Ordered in UC Medications  acetaminophen (TYLENOL) tablet 975 mg (975 mg Oral Given 12/12/23 1327)    Initial Impression / Assessment and Plan / UC Course  I have reviewed the triage vital signs and the nursing notes.  Pertinent labs & imaging results that were available during my care of the patient were reviewed by me and considered in my medical decision making (see chart for details).   Influenza A, Influenza test is positive.   Continue to  alternate Tylenol and ibuprofen for management of fever.   Force fluids to maintain hydration. Tamiflu once daily  for the next 5 days to reduce symptoms and course of influenza virus.   If you develop any shortness of breath, wheezing or difficulty breathing go immediately to the nearest emergency department.   Final Clinical Impressions(s) / UC Diagnoses   Final diagnoses:  Influenza A     Discharge Instructions      Influenza test is positive.  You are considered contagious to others as long as you have a measurable fever with a temperature 100 F.  You should consider yourself less infectious after being fever  free for 24 hours without fever lowering medications. Continue to alternate Tylenol and ibuprofen for management of fever.   Force fluids to maintain hydration. Tamiflu once daily for the next 5 days to reduce symptoms and course of influenza virus.  Promethazine DM for cough and congestion.  If you develop any shortness of breath, wheezing or difficulty breathing go immediately to the nearest emergency department.      ED Prescriptions     Medication Sig Dispense Auth. Provider   oseltamivir (TAMIFLU) 75 MG capsule Take 1 capsule (75 mg total) by mouth 2 (two) times daily. 10 capsule Bing Neighbors, NP   promethazine-dextromethorphan (PROMETHAZINE-DM) 6.25-15 MG/5ML syrup Take 5 mLs by mouth 4 (four) times daily as needed for cough. 240 mL Bing Neighbors, NP      PDMP not reviewed this encounter.   Bing Neighbors, NP 12/12/23 1407    Bing Neighbors, NP 12/12/23 [phone]

## 2023-12-12 NOTE — ED Triage Notes (Addendum)
Has had cough since yesterday. Has sore throat, chest discomfort, headache, loss of appetite, cramping in right leg, loss of taste. Has had mucinex.

## 2024-02-01 ENCOUNTER — Other Ambulatory Visit: Payer: Self-pay | Admitting: Family Medicine

## 2024-02-01 DIAGNOSIS — Z1231 Encounter for screening mammogram for malignant neoplasm of breast: Secondary | ICD-10-CM

## 2024-07-31 ENCOUNTER — Ambulatory Visit
Admission: RE | Admit: 2024-07-31 | Discharge: 2024-07-31 | Disposition: A | Discharge status: Home / Self Care | Source: Ambulatory Visit | Attending: Family Medicine | Admitting: Family Medicine

## 2024-07-31 DIAGNOSIS — Z1231 Encounter for screening mammogram for malignant neoplasm of breast: Secondary | ICD-10-CM
